# Patient Record
Sex: Female | Born: 1995 | Race: White | Hispanic: No | Marital: Single | Smoking: Never smoker
Health system: Southern US, Community
[De-identification: ages and names within clinical notes are randomized; demographics above are authoritative.]

## PROBLEM LIST (undated history)

## (undated) DIAGNOSIS — J45909 Unspecified asthma, uncomplicated: Secondary | ICD-10-CM

## (undated) HISTORY — PX: WISDOM TOOTH EXTRACTION: SHX21

---
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
## 2020-04-03 ENCOUNTER — Encounter: Payer: Self-pay | Admitting: Physician Assistant

## 2020-05-12 ENCOUNTER — Ambulatory Visit (INDEPENDENT_AMBULATORY_CARE_PROVIDER_SITE_OTHER): Payer: 59 | Admitting: Physician Assistant

## 2020-05-12 ENCOUNTER — Encounter: Payer: Self-pay | Admitting: Physician Assistant

## 2020-05-12 VITALS — BP 96/64 | HR 84 | Ht 63.0 in | Wt 171.0 lb

## 2020-05-12 DIAGNOSIS — F909 Attention-deficit hyperactivity disorder, unspecified type: Secondary | ICD-10-CM | POA: Diagnosis not present

## 2020-05-12 DIAGNOSIS — K219 Gastro-esophageal reflux disease without esophagitis: Secondary | ICD-10-CM | POA: Diagnosis not present

## 2020-05-12 MED ORDER — OMEPRAZOLE 40 MG PO CPDR: DELAYED_RELEASE_CAPSULE | ORAL | 6 refills | Status: AC

## 2020-05-12 MED ORDER — FAMOTIDINE 20 MG PO TABS: 20.0000 mg | ORAL_TABLET | Freq: Every day | ORAL | 3 refills | Status: AC

## 2020-05-12 NOTE — Patient Instructions (Addendum)
If you are age 22 or older, your body mass index should be between 23-30. Your Body mass index is 30.29 kg/m. If this is out of the aforementioned range listed, please consider follow up with your Primary Care Provider.  If you are age 76 or younger, your body mass index should be between 19-25. Your Body mass index is 30.29 kg/m. If this is out of the aformentioned range listed, please consider follow up with your Primary Care Provider.   You have been scheduled for a Barium Esophogram at [facility] located at [address]. on Tuesday, 05-26-20 at 11:15 am. Please arrive 15 minutes prior to your appointment for registration. Please eat lightly the morning of the exam. If you need to reschedule for any reason, please contact radiology at [phone] to do so. __________________________________________________________________ A barium swallow is an examination that concentrates on views of the esophagus. This tends to be a double contrast exam (barium and two liquids which, when combined, create a gas to distend the wall of the oesophagus) or single contrast (non-ionic iodine based). The study is usually tailored to your symptoms so a good history is essential. Attention is paid during the study to the form, structure and configuration of the esophagus, looking for functional disorders (such as aspiration, dysphagia, achalasia, motility and reflux) EXAMINATION You may be asked to change into a gown, depending on the type of swallow being performed. A radiologist and radiographer will perform the procedure. The radiologist will advise you of the type of contrast selected for your procedure and direct you during the exam. You will be asked to stand, sit or lie in several different positions and to hold a small amount of fluid in your mouth before being asked to swallow while the imaging is performed .In some instances you may be asked to swallow barium coated marshmallows to assess the motility  of a solid food bolus. The exam can be recorded as a digital or video fluoroscopy procedure. POST PROCEDURE It will take 1-2 days for the barium to pass through your system. To facilitate this, it is important, unless otherwise directed, to increase your fluids for the next 24-48hrs and to resume your normal diet.  This test typically takes about 30 minutes to perform. ______________________________________________________________________  We have sent the following medications to your pharmacy for you to pick up at your convenience: Omeprazole 40 mg: Take daily before dinner   Famotidine (Pepcid) 20 mg: take daily at bedtime  We are giving you Anti-Reflux precautions and GERD diet handouts today.  Thank you for entrusting me with your care and for choosing [facility], Amy Oswald Hillock, P.A.- C.

## 2020-05-12 NOTE — Progress Notes (Signed)
Subjective:    Patient ID: Lindsay Lynch, female    DOB: Apr 07, 1996, 24 y.o.   MRN: [id number]  HPI  Lindsay Lynch is a pleasant 24 year old white female, new to GI today, self-referred for evaluation of chronic GERD.  Patient has not had any prior GI evaluation. Her mother relates that she was told that Lindsay Lynch was having reflux as a baby.  Patient herself feels that she has been having no significant symptoms over the past 3 years since her first year of college.  She has been on omeprazole 40 mg p.o. daily over the past several years.  She says that she started having bad episodes of heartburn and indigestion in the evenings, fairly frequently waking her from sleep.  She says she would wake up with coughing and choking.  She continues to have these episodes at least 1 time per week.  Generally she is not bothered by any significant symptoms during the daytime but later in the day and in the evenings she will have heartburn and indigestion worse some days than others. She has not been following antireflux diet.  She says she does consume a lot of caffeine in the form of sodas.  She feels that one of her trigger foods hotdogs which she did have last evening.  She also feels that alcohol will aggravate her symptoms.  She denies any dysphagia or odynophagia.  No complaints of abdominal pain bloating gas .  She will occasionally have some nausea if she has a bad episode of acid reflux.    Review of Systems Pertinent positive and negative review of systems were noted in the above HPI section.  All other review of systems was otherwise negative.  Outpatient Encounter Medications as of 05/12/2020  Medication Sig  . norethindrone-ethinyl estradiol (LOESTRIN FE) 1-20 MG-MCG tablet Take 1 tablet by mouth daily.  Marland Kitchen omeprazole (PRILOSEC) 40 MG capsule Take 40 mg by mouth daily before dinner  . VYVANSE 40 MG capsule Take 40 mg by mouth every morning.  . [DISCONTINUED] omeprazole (PRILOSEC) 40 MG capsule Take 40 mg by  mouth daily.  . famotidine (PEPCID) 20 MG tablet Take 1 tablet (20 mg total) by mouth at bedtime.  . [DISCONTINUED] methylphenidate 36 MG PO CR tablet Take 36 mg by mouth daily.  . [DISCONTINUED] MICROGESTIN 1.5-30 MG-MCG tablet Take 1 tablet by mouth daily.   No facility-administered encounter medications on file as of 05/12/2020.   Allergies  Allergen Reactions  . Amoxicillin Hives   There are no problems to display for this patient.  Social History   Socioeconomic History  . Marital status: Single    Spouse name: Not on file  . Number of children: Not on file  . Years of education: Not on file  . Highest education level: Not on file  Occupational History  . Not on file  Tobacco Use  . Smoking status: Never Smoker  . Smokeless tobacco: Never Used  Vaping Use  . Vaping Use: Never used  Substance and Sexual Activity  . Alcohol use: No  . Drug use: No  . Sexual activity: Never  Other Topics Concern  . Not on file  Social History Narrative  . Not on file   Social Determinants of Health   Financial Resource Strain:   . Difficulty of Paying Living Expenses:   Food Insecurity:   . Worried About Charity fundraiser in the Last Year:   . [place] in the Last Year:  Transportation Needs:   . Freight forwarder (Medical):   Marland Kitchen Lack of Transportation (Non-Medical):   Physical Activity:   . Days of Exercise per Week:   . Minutes of Exercise per Session:   Stress:   . Feeling of Stress :   Social Connections:   . Frequency of Communication with Friends and Family:   . Frequency of Social Gatherings with Friends and Family:   . Attends Religious Services:   . Active Member of Clubs or Organizations:   . Attends Banker Meetings:   Marland Kitchen Marital Status:   Intimate Partner Violence:   . Fear of Current or Ex-Partner:   . Emotionally Abused:   Marland Kitchen Physically Abused:   . Sexually Abused:     Ms. Mcgloin family history includes Colon polyps in her  maternal grandfather; Diabetes in an other family member; Hypertension in her father, mother, and another family member.      Objective:    Vitals:   05/12/20 1457  BP: 96/64  Pulse: 84    Physical Exam Well-developed well-nourished young white female, accompanied by her mother- in no acute distress.  Height, Weight, 171 BMI 30.29  HEENT; nontraumatic normocephalic, EOMI, PE RR LA, sclera anicteric. Oropharynx; not done Neck; supple, no JVD Cardiovascular; regular rate and rhythm with S1-S2, no murmur rub or gallop Pulmonary; Clear bilaterally Abdomen; soft, nontender, nondistended, no palpable mass or hepatosplenomegaly, bowel sounds are active Rectal; not done today Skin; benign exam, no jaundice rash or appreciable lesions Extremities; no clubbing cyanosis or edema skin warm and dry Neuro/Psych; alert and oriented x4, grossly nonfocal mood and affect appropriate       Assessment & Plan:   #56 24 year old white female with chronic GERD, symptomatic over the past 5 to 6 years, and currently having persistent symptoms despite omeprazole 40 mg AC dinner.  Most of her symptoms occur in the evenings and at nighttime within an hour or 2 of going to bed.  Symptoms are associated with regurgitation, frequently waking up with coughing and choking.  Rule out associated hiatal hernia, rule out incompetent LES.  No symptoms suggestive of gastroparesis.  #2 ADHD  Plan; we discussed an antireflux diet, and she was provided with a copy.  Advised she significantly decrease her caffeine intake, minimize alcohol, work on eating earlier in the evening with at least 2 to 3-hour window n.p.o. prior to bedtime.  We also discussed elevation of the back about 45 degrees for sleep.  Continue omeprazole 40 mg AC dinner Start trial of Pepcid 20 mg p.o. nightly. Schedule for barium swallow. Patient will be established with Dr. Myrtie Neither, we will plan follow-up based on response to above and results of  barium swallow.  Given young age and chronic significant symptoms consider candidacy for TIF.   Shaheim Mahar S Onesty Clair PA-C 05/12/2020   Cc: No ref. provider found

## 2020-05-14 NOTE — Progress Notes (Signed)
____________________________________________________________  Attending physician addendum:  Thank you for sending this case to me. I have reviewed the entire note, and the outlined plan seems appropriate.  Cassidey Barrales Danis, MD  ____________________________________________________________  

## 2020-05-20 ENCOUNTER — Ambulatory Visit (HOSPITAL_COMMUNITY): Payer: 59

## 2020-05-26 ENCOUNTER — Ambulatory Visit
Admission: RE | Admit: 2020-05-26 | Discharge: 2020-05-26 | Disposition: A | Discharge status: Home / Self Care | Payer: 59 | Source: Ambulatory Visit | Attending: Physician Assistant | Admitting: Physician Assistant

## 2020-05-26 DIAGNOSIS — K219 Gastro-esophageal reflux disease without esophagitis: Secondary | ICD-10-CM

## 2020-08-27 ENCOUNTER — Other Ambulatory Visit: Payer: 59

## 2020-08-27 DIAGNOSIS — Z20822 Contact with and (suspected) exposure to covid-19: Secondary | ICD-10-CM

## 2020-08-28 LAB — SARS-COV-2, NAA 2 DAY TAT

## 2020-08-28 LAB — NOVEL CORONAVIRUS, NAA: SARS-CoV-2, NAA: NOT DETECTED

## 2020-09-04 ENCOUNTER — Other Ambulatory Visit: Payer: Self-pay | Admitting: Family Medicine

## 2020-09-04 ENCOUNTER — Ambulatory Visit
Admission: RE | Admit: 2020-09-04 | Discharge: 2020-09-04 | Disposition: A | Discharge status: Home / Self Care | Payer: 59 | Source: Ambulatory Visit | Attending: Family Medicine | Admitting: Family Medicine

## 2020-09-04 ENCOUNTER — Other Ambulatory Visit: Payer: Self-pay

## 2020-09-04 DIAGNOSIS — S0992XA Unspecified injury of nose, initial encounter: Secondary | ICD-10-CM

## 2020-11-26 ENCOUNTER — Other Ambulatory Visit: Payer: 59

## 2020-11-26 DIAGNOSIS — Z20822 Contact with and (suspected) exposure to covid-19: Secondary | ICD-10-CM

## 2020-11-27 LAB — SPECIMEN STATUS REPORT

## 2020-11-27 LAB — SARS-COV-2, NAA 2 DAY TAT

## 2020-11-27 LAB — NOVEL CORONAVIRUS, NAA: SARS-CoV-2, NAA: NOT DETECTED

## 2021-01-26 IMAGING — RF DG ESOPHAGUS
9 series · 14 of 24 positions shown · non-contrast
Comparison: None.

CLINICAL DATA: Esophageal dysphagia and GE reflux symptoms.

EXAM:
ESOPHOGRAM / BARIUM SWALLOW / BARIUM TABLET STUDY
TECHNIQUE: Combined double contrast and single contrast examination performed
using effervescent crystals, thick barium liquid, and thin barium
liquid. The patient was observed with fluoroscopy swallowing a 13 mm
barium sulphate tablet.
FLUOROSCOPY TIME:  Fluoroscopy Time:  1 minutes and 36 seconds
Radiation Exposure Index (if provided by the fluoroscopic device):
114 mGy
Number of Acquired Spot Images: 0

[Series 1: sequence · 0.31mm/px · 2 of 11 frames shown (1 of 8)]
[frame 1/11]
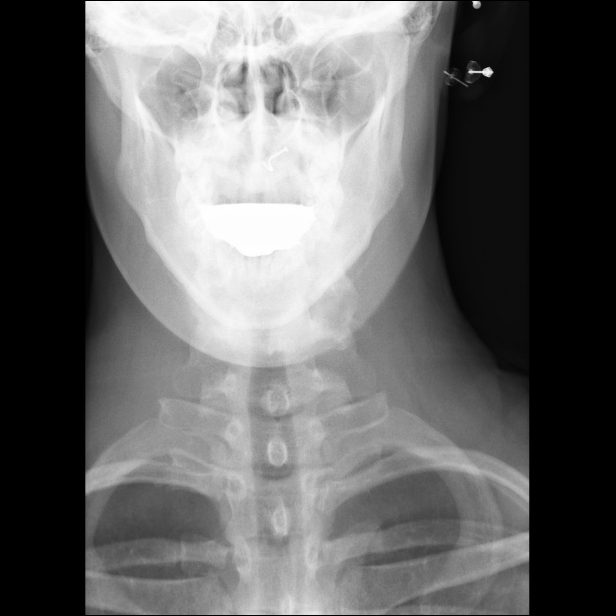
[frame 10/11]
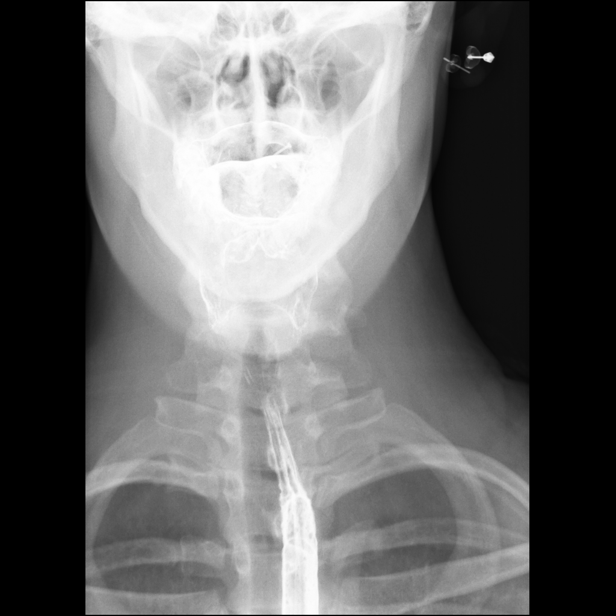

[Series 2: sequence · 0.31mm/px · 1 of 9 frames shown (2 of 8)]
[frame 8/9]
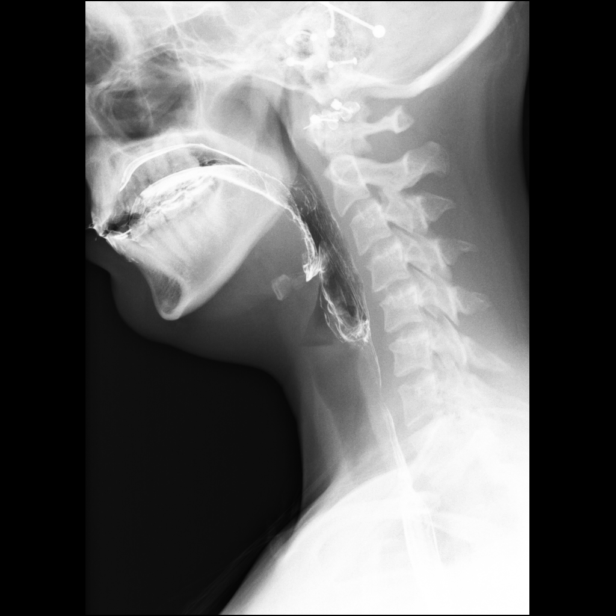

[Series 3: sequence · 0.28mm/px · 2 of 11 frames shown (3 of 8)]
[frame 2/11]
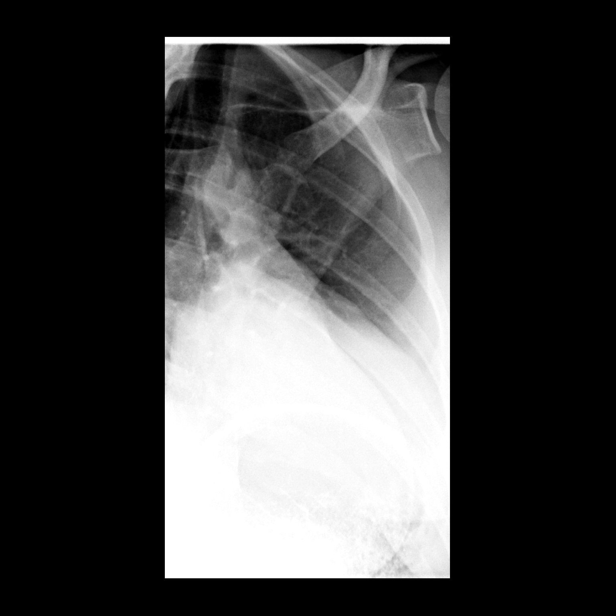
[frame 10/11]
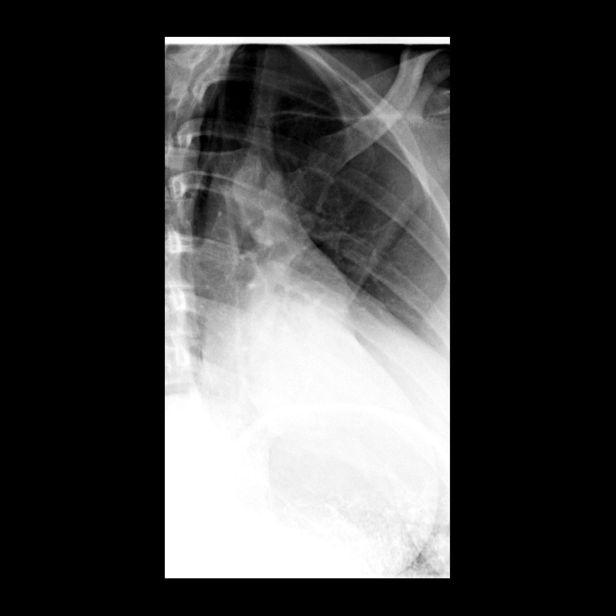

[Series 4: sequence · 0.28mm/px · 1 of 16 frames shown (4 of 8)]
[frame 9/16]
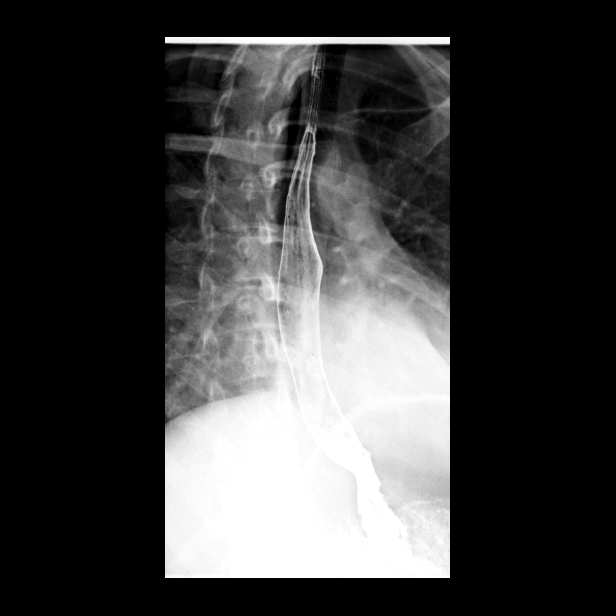

[Series 5: sequence · 2 of 27 frames shown (5 of 8)]
[frame 5/27]
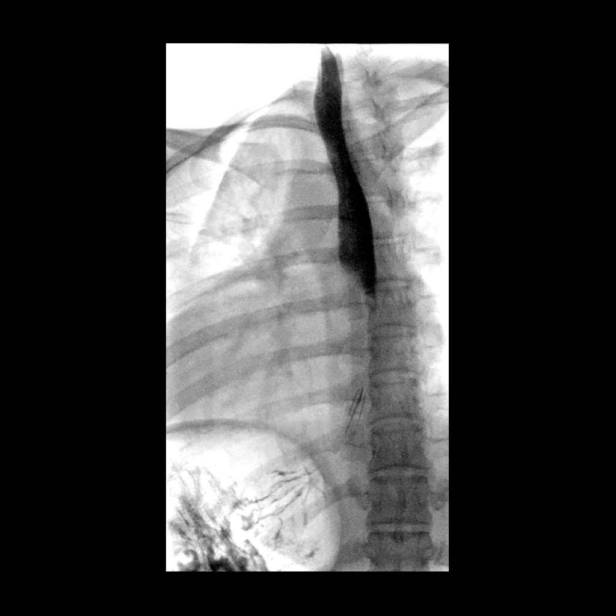
[frame 18/27]
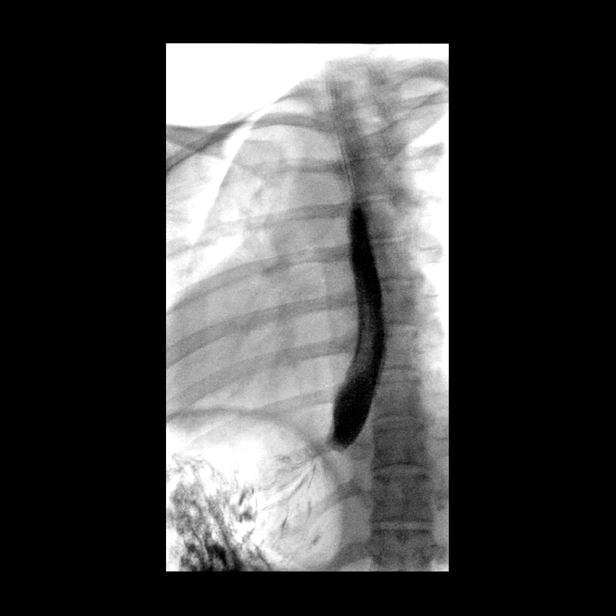

[Series 6: sequence · 2 of 27 frames shown (6 of 8)]
[frame 5/27]
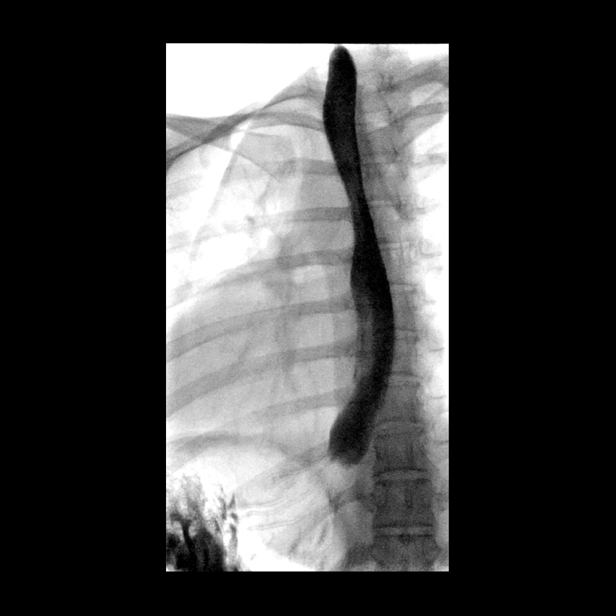
[frame 23/27]
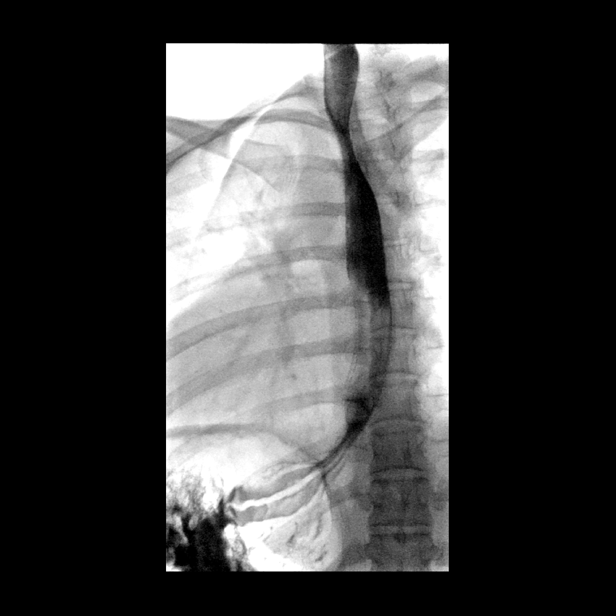

[Series 7: sequence · 2 of 5 frames shown (7 of 8)]
[frame 4/5]
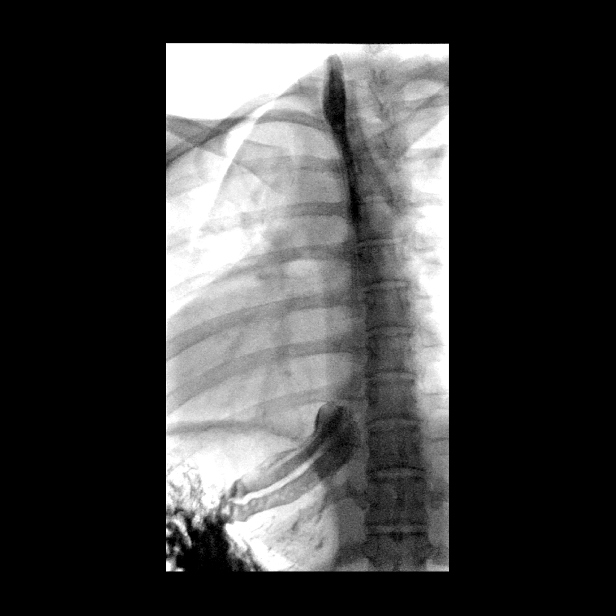
[frame 5/5]
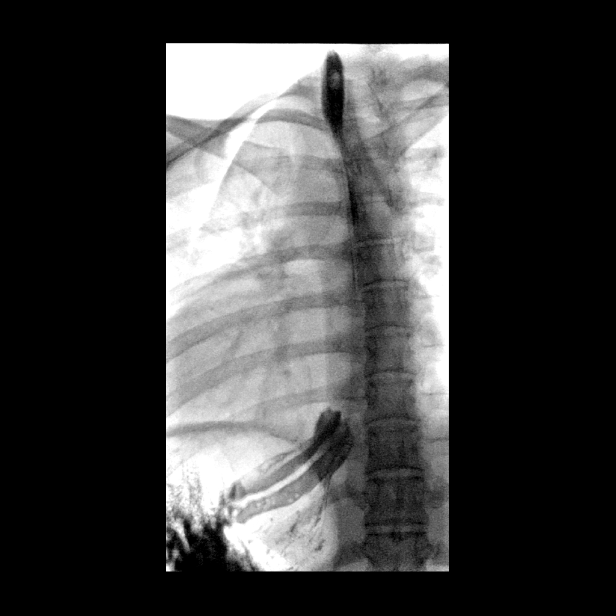

[Series 8: sequence · 1 of 25 frames shown (8 of 8)]
[frame 15/25]
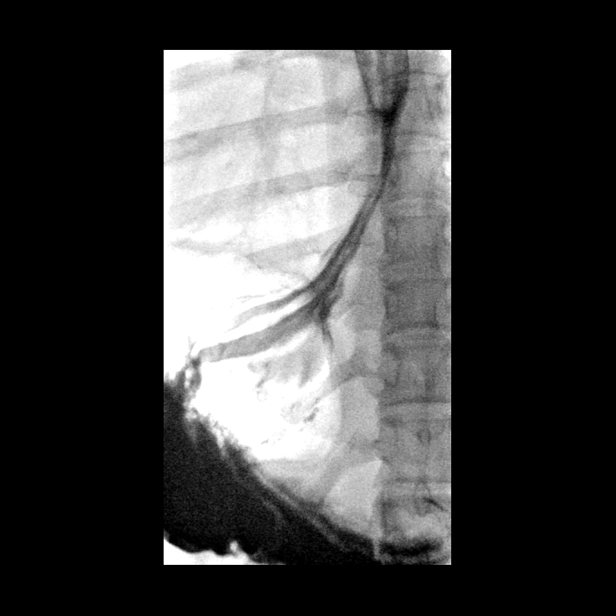

[Series 9: one shot · 1 of 2 slices shown]
[im 2/2]
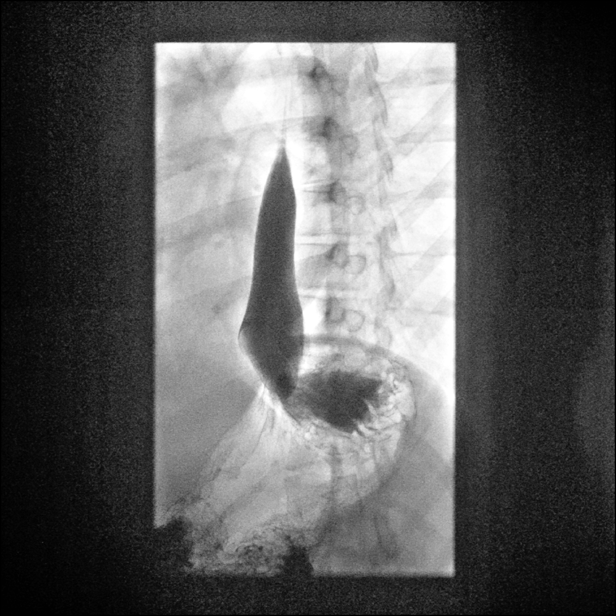

[14 of 24 positions shown; findings below may reference images not displayed]

FINDINGS: Initial barium swallows demonstrate normal pharyngeal motion with
swallowing. No laryngeal penetration or aspiration. No upper
esophageal webs, strictures or diverticuli.

Normal esophageal motility. No intrinsic or extrinsic lesions of the
esophagus are identified. There is a small sliding-type hiatal
hernia and inducible GE reflux with water swallowing.

The 13 mm barium pill passed into the stomach without difficulty.
IMPRESSION: 1. Normal esophageal motility.
2. Sliding-type hiatal hernia with inducible GE reflux.
3. No mass or stricture.

## 2021-05-07 IMAGING — CR DG NASAL BONES 3+V
3 series · 3 of 3 positions shown · non-contrast
Comparison: None.

CLINICAL DATA: Injury to the left side of the face 4 days ago.
Pain. Initial encounter.

EXAM:
NASAL BONES - 3+ VIEW

[w waters pa]
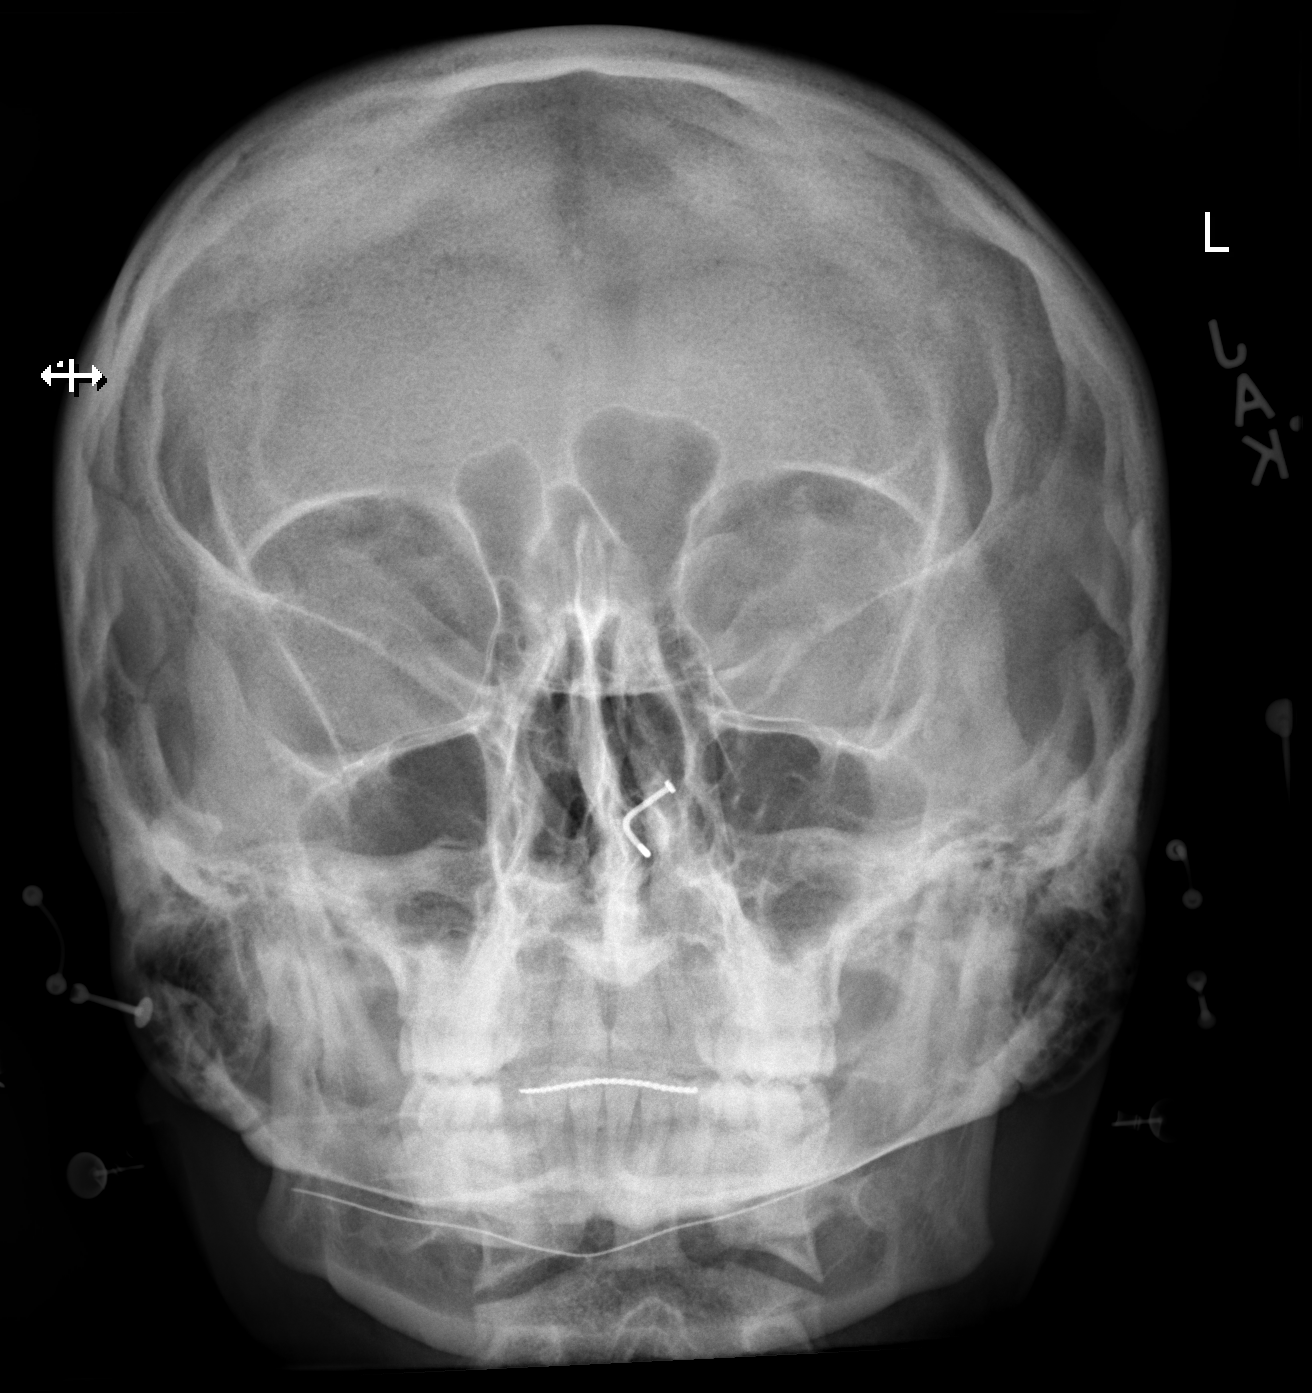

[w nasal bone lat (1 of 2)]
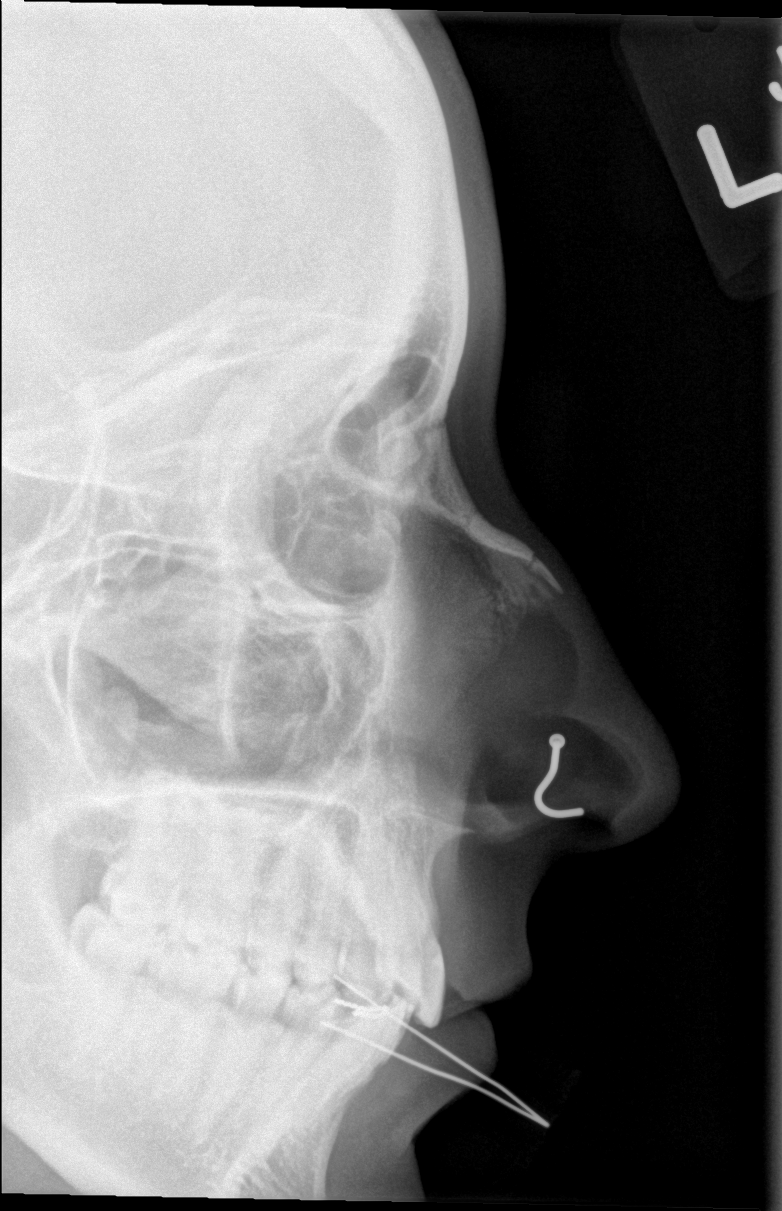

[w nasal bone lat (2 of 2)]
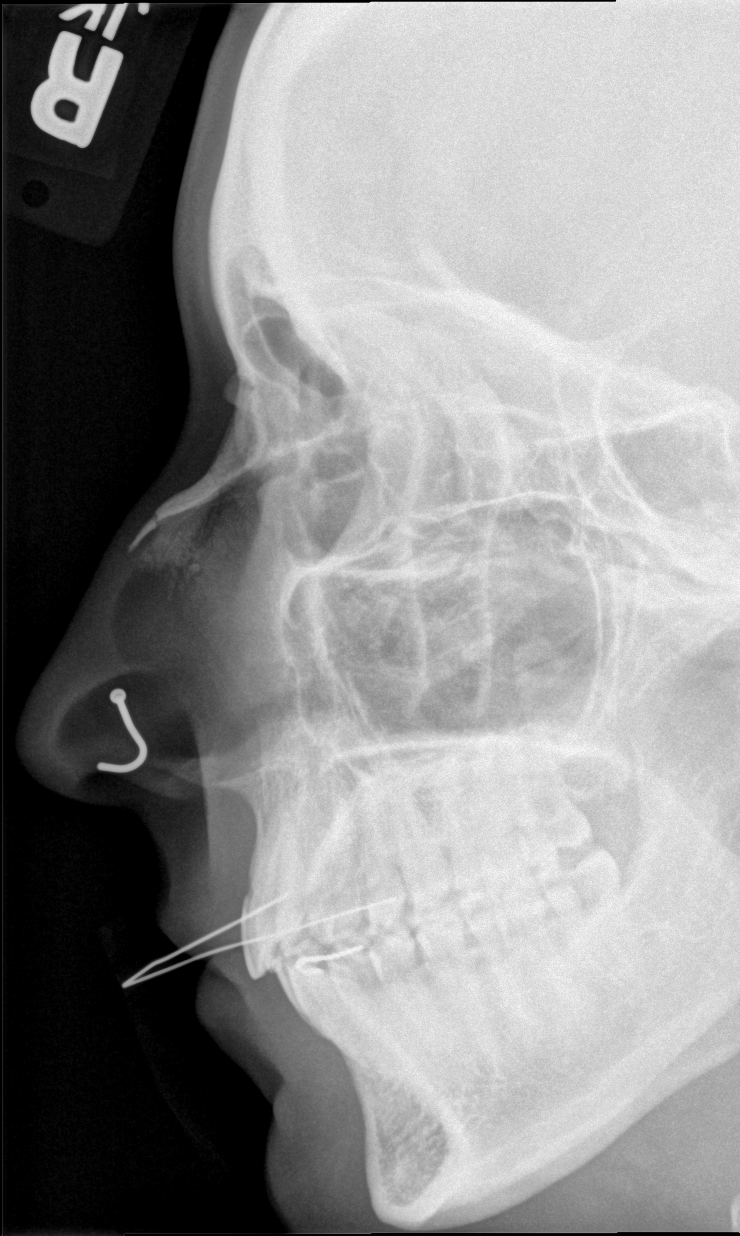

[3 of 3 positions shown; findings below may reference images not displayed]

FINDINGS: Nose piercing is noted. No fracture or focal bony abnormality is
seen.
IMPRESSION: Negative for fracture.
